# Patient Record
Sex: Male | Born: 1995 | Race: White | Hispanic: No | Marital: Single | Smoking: Never smoker
Health system: Southern US, Community
[De-identification: ages and names within clinical notes are randomized; demographics above are authoritative.]

## PROBLEM LIST (undated history)

## (undated) DIAGNOSIS — Z789 Other specified health status: Secondary | ICD-10-CM

## (undated) HISTORY — PX: TOE SURGERY: SHX1073

---
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
## 2017-09-20 DIAGNOSIS — J020 Streptococcal pharyngitis: Secondary | ICD-10-CM | POA: Diagnosis not present

## 2017-09-20 DIAGNOSIS — R509 Fever, unspecified: Secondary | ICD-10-CM | POA: Diagnosis not present

## 2018-08-09 DIAGNOSIS — L723 Sebaceous cyst: Secondary | ICD-10-CM | POA: Diagnosis not present

## 2018-12-26 DIAGNOSIS — S63502A Unspecified sprain of left wrist, initial encounter: Secondary | ICD-10-CM | POA: Diagnosis not present

## 2019-01-06 DIAGNOSIS — S63502D Unspecified sprain of left wrist, subsequent encounter: Secondary | ICD-10-CM | POA: Diagnosis not present

## 2019-01-13 DIAGNOSIS — R52 Pain, unspecified: Secondary | ICD-10-CM | POA: Diagnosis not present

## 2019-01-13 DIAGNOSIS — M25532 Pain in left wrist: Secondary | ICD-10-CM | POA: Diagnosis not present

## 2019-01-31 DIAGNOSIS — L723 Sebaceous cyst: Secondary | ICD-10-CM | POA: Diagnosis not present

## 2019-03-06 ENCOUNTER — Other Ambulatory Visit: Payer: Self-pay | Admitting: Family Medicine

## 2019-03-06 DIAGNOSIS — M7989 Other specified soft tissue disorders: Secondary | ICD-10-CM

## 2019-03-07 DIAGNOSIS — M7989 Other specified soft tissue disorders: Secondary | ICD-10-CM | POA: Diagnosis not present

## 2019-03-09 ENCOUNTER — Other Ambulatory Visit: Payer: 59

## 2019-04-04 ENCOUNTER — Other Ambulatory Visit: Payer: Self-pay | Admitting: Urology

## 2019-04-24 ENCOUNTER — Encounter (HOSPITAL_BASED_OUTPATIENT_CLINIC_OR_DEPARTMENT_OTHER): Payer: Self-pay | Admitting: *Deleted

## 2019-04-24 ENCOUNTER — Other Ambulatory Visit: Payer: Self-pay

## 2019-04-24 NOTE — Progress Notes (Signed)
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

## 2019-04-26 ENCOUNTER — Other Ambulatory Visit (HOSPITAL_COMMUNITY)
Admission: RE | Admit: 2019-04-26 | Discharge: 2019-04-26 | Disposition: A | Discharge status: Home / Self Care | Payer: 59 | Source: Ambulatory Visit | Attending: Urology | Admitting: Urology

## 2019-04-26 DIAGNOSIS — Z1159 Encounter for screening for other viral diseases: Secondary | ICD-10-CM | POA: Diagnosis not present

## 2019-04-26 DIAGNOSIS — Z01812 Encounter for preprocedural laboratory examination: Secondary | ICD-10-CM | POA: Insufficient documentation

## 2019-04-27 LAB — NOVEL CORONAVIRUS, NAA (HOSP ORDER, SEND-OUT TO REF LAB; TAT 18-24 HRS): SARS-CoV-2, NAA: NOT DETECTED

## 2019-04-27 NOTE — Progress Notes (Signed)
SPOKE W/  _      SCREENING SYMPTOMS OF COVID 19:   COUGH--  RUNNY NOSE---   SORE THROAT---  NASAL CONGESTION----  SNEEZING----  SHORTNESS OF BREATH---  DIFFICULTY BREATHING---  TEMP >100.0 -----  UNEXPLAINED BODY ACHES------  CHILLS --------   HEADACHES ---------  LOSS OF SMELL/ TASTE --------    HAVE YOU OR ANY FAMILY MEMBER TRAVELLED PAST 14 DAYS OUT OF THE   COUNTY--- STATE---- COUNTRY----  HAVE YOU OR ANY FAMILY MEMBER BEEN EXPOSED TO ANYONE WITH COVID 19?    Left message on voice mail at [phone]  and [phone] to return call for screening.

## 2019-04-27 NOTE — Progress Notes (Signed)
SPOKE W/ Algis     SCREENING SYMPTOMS OF COVID 19:   COUGH--  no  RUNNY NOSE--- no  SORE THROAT--- no  NASAL CONGESTION---- no  SNEEZING---- no  SHORTNESS OF BREATH--- no   DIFFICULTY BREATHING--- no  TEMP >100.0 ----- no  UNEXPLAINED BODY ACHES------ no  CHILLS --------  No   HEADACHES --------- no  LOSS OF SMELL/ TASTE -------- no    HAVE YOU OR ANY FAMILY MEMBER TRAVELLED PAST 14 DAYS OUT OF THE   COUNTY--- no STATE---- no COUNTRY---- no   HAVE YOU OR ANY FAMILY MEMBER BEEN EXPOSED TO ANYONE WITH COVID 19?  no

## 2019-04-28 ENCOUNTER — Ambulatory Visit (HOSPITAL_BASED_OUTPATIENT_CLINIC_OR_DEPARTMENT_OTHER)
Admission: RE | Admit: 2019-04-28 | Discharge: 2019-04-28 | Disposition: A | Discharge status: Home / Self Care | Payer: 59 | Attending: Urology | Admitting: Urology

## 2019-04-28 ENCOUNTER — Encounter (HOSPITAL_BASED_OUTPATIENT_CLINIC_OR_DEPARTMENT_OTHER)
Admission: RE | Disposition: A | Discharge status: Home / Self Care | Payer: Self-pay | Source: Home / Self Care | Attending: Urology

## 2019-04-28 ENCOUNTER — Ambulatory Visit (HOSPITAL_BASED_OUTPATIENT_CLINIC_OR_DEPARTMENT_OTHER): Payer: 59 | Admitting: Anesthesiology

## 2019-04-28 ENCOUNTER — Encounter (HOSPITAL_BASED_OUTPATIENT_CLINIC_OR_DEPARTMENT_OTHER): Payer: Self-pay

## 2019-04-28 DIAGNOSIS — L723 Sebaceous cyst: Secondary | ICD-10-CM | POA: Diagnosis not present

## 2019-04-28 HISTORY — DX: Other specified health status: Z78.9

## 2019-04-28 HISTORY — PX: CYST EXCISION: SHX5701

## 2019-04-28 SURGERY — CYST REMOVAL
Anesthesia: General | Laterality: Bilateral

## 2019-04-28 MED ORDER — TRAMADOL HCL 50 MG PO TABS: 50.0000 mg | ORAL_TABLET | Freq: Four times a day (QID) | ORAL | 0 refills | Status: AC | PRN

## 2019-04-28 MED ORDER — EPHEDRINE SULFATE-NACL 50-0.9 MG/10ML-% IV SOSY
PREFILLED_SYRINGE | INTRAVENOUS | Status: DC | PRN
  Administered 2019-04-28: 5 mg via INTRAVENOUS
  Administered 2019-04-28: 10 mg via INTRAVENOUS

## 2019-04-28 MED ORDER — BUPIVACAINE HCL (PF) 0.25 % IJ SOLN
INTRAMUSCULAR | Status: DC | PRN
  Administered 2019-04-28: 10 mL

## 2019-04-28 MED ORDER — FENTANYL CITRATE (PF) 100 MCG/2ML IJ SOLN
INTRAMUSCULAR | Status: DC | PRN
  Administered 2019-04-28 (×2): 50 ug via INTRAVENOUS

## 2019-04-28 MED ORDER — DEXAMETHASONE SODIUM PHOSPHATE 10 MG/ML IJ SOLN
INTRAMUSCULAR | Status: AC
  Filled 2019-04-28: qty 1

## 2019-04-28 MED ORDER — CLINDAMYCIN PHOSPHATE 900 MG/50ML IV SOLN
INTRAVENOUS | Status: AC
  Filled 2019-04-28: qty 50

## 2019-04-28 MED ORDER — LIDOCAINE 2% (20 MG/ML) 5 ML SYRINGE
INTRAMUSCULAR | Status: AC
  Filled 2019-04-28: qty 5

## 2019-04-28 MED ORDER — MEPERIDINE HCL 25 MG/ML IJ SOLN
6.2500 mg | INTRAMUSCULAR | Status: DC | PRN
  Filled 2019-04-28: qty 1

## 2019-04-28 MED ORDER — LIDOCAINE 2% (20 MG/ML) 5 ML SYRINGE
INTRAMUSCULAR | Status: DC | PRN
  Administered 2019-04-28: 100 mg via INTRAVENOUS

## 2019-04-28 MED ORDER — CLINDAMYCIN PHOSPHATE 900 MG/50ML IV SOLN
900.0000 mg | INTRAVENOUS | Status: AC
  Administered 2019-04-28: 900 mg via INTRAVENOUS
  Filled 2019-04-28: qty 50

## 2019-04-28 MED ORDER — OXYCODONE HCL 5 MG PO TABS
5.0000 mg | ORAL_TABLET | Freq: Once | ORAL | Status: DC | PRN
  Filled 2019-04-28: qty 1

## 2019-04-28 MED ORDER — OXYCODONE HCL 5 MG/5ML PO SOLN
5.0000 mg | Freq: Once | ORAL | Status: DC | PRN
  Filled 2019-04-28: qty 5

## 2019-04-28 MED ORDER — PROMETHAZINE HCL 25 MG/ML IJ SOLN
6.2500 mg | INTRAMUSCULAR | Status: DC | PRN
  Filled 2019-04-28: qty 1

## 2019-04-28 MED ORDER — EPHEDRINE 5 MG/ML INJ
INTRAVENOUS | Status: AC
  Filled 2019-04-28: qty 10

## 2019-04-28 MED ORDER — ONDANSETRON HCL 4 MG/2ML IJ SOLN
INTRAMUSCULAR | Status: DC | PRN
  Administered 2019-04-28: 4 mg via INTRAVENOUS

## 2019-04-28 MED ORDER — GLYCOPYRROLATE PF 0.2 MG/ML IJ SOSY
PREFILLED_SYRINGE | INTRAMUSCULAR | Status: DC | PRN
  Administered 2019-04-28: .2 mg via INTRAVENOUS

## 2019-04-28 MED ORDER — HYDROMORPHONE HCL 1 MG/ML IJ SOLN
0.2500 mg | INTRAMUSCULAR | Status: DC | PRN
  Filled 2019-04-28: qty 0.5

## 2019-04-28 MED ORDER — DEXAMETHASONE SODIUM PHOSPHATE 4 MG/ML IJ SOLN
INTRAMUSCULAR | Status: DC | PRN
  Administered 2019-04-28: 10 mg via INTRAVENOUS

## 2019-04-28 MED ORDER — MIDAZOLAM HCL 2 MG/2ML IJ SOLN
INTRAMUSCULAR | Status: AC
  Filled 2019-04-28: qty 2

## 2019-04-28 MED ORDER — GLYCOPYRROLATE PF 0.2 MG/ML IJ SOSY
PREFILLED_SYRINGE | INTRAMUSCULAR | Status: AC
  Filled 2019-04-28: qty 1

## 2019-04-28 MED ORDER — PROPOFOL 10 MG/ML IV BOLUS
INTRAVENOUS | Status: AC
  Filled 2019-04-28: qty 20

## 2019-04-28 MED ORDER — LACTATED RINGERS IV SOLN
INTRAVENOUS | Status: DC
  Administered 2019-04-28 (×2): via INTRAVENOUS
  Filled 2019-04-28: qty 1000

## 2019-04-28 MED ORDER — MIDAZOLAM HCL 5 MG/5ML IJ SOLN
INTRAMUSCULAR | Status: DC | PRN
  Administered 2019-04-28: 2 mg via INTRAVENOUS

## 2019-04-28 MED ORDER — PROPOFOL 10 MG/ML IV BOLUS
INTRAVENOUS | Status: DC | PRN
  Administered 2019-04-28: 200 mg via INTRAVENOUS

## 2019-04-28 MED ORDER — FENTANYL CITRATE (PF) 100 MCG/2ML IJ SOLN
INTRAMUSCULAR | Status: AC
  Filled 2019-04-28: qty 2

## 2019-04-28 MED ORDER — ONDANSETRON HCL 4 MG/2ML IJ SOLN
INTRAMUSCULAR | Status: AC
  Filled 2019-04-28: qty 2

## 2019-04-28 SURGICAL SUPPLY — 40 items
BLADE CLIPPER SENSICLIP SURGIC (BLADE) ×3 IMPLANT
BLADE HEX COATED 2.75 (ELECTRODE) ×3 IMPLANT
BLADE SURG 15 STRL LF DISP TIS (BLADE) ×1 IMPLANT
BLADE SURG 15 STRL SS (BLADE) ×2
BNDG GAUZE ELAST 4 BULKY (GAUZE/BANDAGES/DRESSINGS) ×3 IMPLANT
BRIEF STRETCH FOR OB PAD LRG (UNDERPADS AND DIAPERS) ×3 IMPLANT
COVER BACK TABLE 60X90IN (DRAPES) ×3 IMPLANT
COVER MAYO STAND STRL (DRAPES) ×3 IMPLANT
COVER WAND RF STERILE (DRAPES) ×6 IMPLANT
DERMABOND ADVANCED (GAUZE/BANDAGES/DRESSINGS) ×2
DERMABOND ADVANCED .7 DNX12 (GAUZE/BANDAGES/DRESSINGS) ×1 IMPLANT
DISSECTOR ROUND CHERRY 3/8 STR (MISCELLANEOUS) IMPLANT
DRAIN PENROSE 18X1/4 LTX STRL (WOUND CARE) ×3 IMPLANT
DRAPE LAPAROTOMY 100X72 PEDS (DRAPES) ×3 IMPLANT
DRSG TEGADERM 4X4.75 (GAUZE/BANDAGES/DRESSINGS) IMPLANT
ELECT REM PT RETURN 9FT ADLT (ELECTROSURGICAL) ×3
ELECTRODE REM PT RTRN 9FT ADLT (ELECTROSURGICAL) ×1 IMPLANT
GLOVE BIO SURGEON STRL SZ7.5 (GLOVE) ×3 IMPLANT
GOWN STRL REUS W/TWL LRG LVL3 (GOWN DISPOSABLE) ×3 IMPLANT
KIT TURNOVER CYSTO (KITS) ×3 IMPLANT
NEEDLE HYPO 25X1 1.5 SAFETY (NEEDLE) ×3 IMPLANT
NS IRRIG 500ML POUR BTL (IV SOLUTION) IMPLANT
PACK BASIN DAY SURGERY FS (CUSTOM PROCEDURE TRAY) ×3 IMPLANT
PENCIL BUTTON HOLSTER BLD 10FT (ELECTRODE) ×3 IMPLANT
SUT ETHILON 4 0 PS 2 18 (SUTURE) ×3 IMPLANT
SUT MNCRL AB 4-0 PS2 18 (SUTURE) ×3 IMPLANT
SUT VIC AB 2-0 SH 27 (SUTURE)
SUT VIC AB 2-0 SH 27XBRD (SUTURE) IMPLANT
SUT VIC AB 3-0 SH 27 (SUTURE) ×2
SUT VIC AB 3-0 SH 27X BRD (SUTURE) IMPLANT
SUT VIC AB 3-0 SH 27XBRD (SUTURE) ×1 IMPLANT
SUT VIC AB 4-0 SH 27 (SUTURE) ×2
SUT VIC AB 4-0 SH 27XANBCTRL (SUTURE) ×1 IMPLANT
SYR CONTROL 10ML LL (SYRINGE) ×3 IMPLANT
TOWEL OR 17X26 10 PK STRL BLUE (TOWEL DISPOSABLE) ×6 IMPLANT
TRAY DSU PREP LF (CUSTOM PROCEDURE TRAY) ×3 IMPLANT
TUBE CONNECTING 12'X1/4 (SUCTIONS) ×1
TUBE CONNECTING 12X1/4 (SUCTIONS) ×2 IMPLANT
WATER STERILE IRR 500ML POUR (IV SOLUTION) IMPLANT
YANKAUER SUCT BULB TIP NO VENT (SUCTIONS) ×3 IMPLANT

## 2019-04-28 NOTE — Brief Op Note (Signed)
04/28/2019  10:01 AM  PATIENT:  Walter Reyes  23 y.o. male  PRE-OPERATIVE DIAGNOSIS:  bilateral sebaceous scrotal cysts  POST-OPERATIVE DIAGNOSIS:  bilateral sebaceous scrotal cysts  PROCEDURE:  Procedure(s): CYST REMOVAL (Bilateral)  SURGEON:  Surgeon(s) and Role:    Alexis Frock, MD - Primary  PHYSICIAN ASSISTANT:   ASSISTANTS: none   ANESTHESIA:   local and general  EBL:  minimal   BLOOD ADMINISTERED:none  DRAINS: none   LOCAL MEDICATIONS USED:  MARCAINE     SPECIMEN:  Source of Specimen:  penoscrotal sebacous cysts  DISPOSITION OF SPECIMEN:  discard  COUNTS:  YES  TOURNIQUET:  * No tourniquets in log *  DICTATION: .Other Dictation: Dictation Number [id number]  PLAN OF CARE: Discharge to home after PACU  PATIENT DISPOSITION:  PACU - hemodynamically stable.   Delay start of Pharmacological VTE agent (>24hrs) due to surgical blood loss or risk of bleeding: yes

## 2019-04-28 NOTE — Transfer of Care (Addendum)
Last Vitals:  Vitals Value Taken Time  BP 120/52 04/28/19 1017  Temp    Pulse 70 04/28/19 1017  Resp 13 04/28/19 1017  SpO2 100 % 04/28/19 1017    Last Pain:  Vitals:   04/28/19 0742  TempSrc: Oral  PainSc: 0-No pain      Patients Stated Pain Goal: 6 (04/28/19 7253)  Immediate Anesthesia Transfer of Care Note  Patient: Walter Reyes  Procedure(s) Performed: Procedure(s) (LRB): CYST REMOVAL (Bilateral)  Patient Location: PACU  Anesthesia Type: General  Level of Consciousness: sleepy  Airway & Oxygen Therapy: Patient Spontanous Breathing and Patient connected to nasal cannula oxygen, oral airway in place.  Post-op Assessment: Report given to PACU RN and Post -op Vital signs reviewed and stable  Post vital signs: Reviewed and stable  Complications: No apparent anesthesia complications

## 2019-04-28 NOTE — H&P (Signed)
Walter Reyes is an 23 y.o. male.    Chief Complaint: Pre-OP Penoscrotal cyst removal  HPI:   1 - Scrotal Sebaceous Cysts - several bilatearl cysts stable since mid teens. Left penoscrotal 1cm and crop of smaller ones right penoscrotal. No h/o superinfection. Non-diabetic. He is increasingly bothored by appearance and that they will catch on things.   PMH UNremarkable. NO CV disease / blood thinners. He works in [employer]. His PCP is Dibas Koirala MD.   Today "Walter Reyes" is seen to proceed with excision of penoscrotal cysts. No interval fevers.    Past Medical History:  Diagnosis Date  . Medical history non-contributory     Past Surgical History:  Procedure Laterality Date  . TOE SURGERY      History reviewed. No pertinent family history. Social History:  reports that he has never smoked. He has never used smokeless tobacco. He reports that he does not drink alcohol or use drugs.  Allergies: No Known Allergies  No medications prior to admission.    Results for orders placed or performed during the hospital encounter of 04/26/19 (from the past 48 hour(s))  Novel Coronavirus, NAA (hospital order; send-out to ref lab)     Status: None   Collection Time: 04/26/19  9:31 AM   Specimen: Nasopharyngeal Swab; Respiratory  Result Value Ref Range   SARS-CoV-2, NAA NOT DETECTED NOT DETECTED    Comment: (NOTE) Testing was performed using the cobas(R) SARS-CoV-2 test. This test was developed and its performance characteristics determined by [employer]. This test has not been FDA cleared or approved. This test has been authorized by FDA under an Emergency Use Authorization (EUA). This test is only authorized for the duration of time the declaration that circumstances exist justifying the authorization of the emergency use of in vitro diagnostic tests for detection of SARS-CoV-2 virus and/or diagnosis of COVID-19 infection under section 564(b)(1) of the Act, 21  U.S.C. 034VQQ-5(Z)(5), unless the authorization is terminated or revoked sooner. When diagnostic testing is negative, the possibility of a false negative result should be considered in the context of a patient's recent exposures and the presence of clinical signs and symptoms consistent with COVID-19. An individual without symptoms of COVID-19 and who is not shedding SARS-CoV-2 virus would expect to have  a negative (not detected) result in this assay. Performed At: [facility] [place], [place] [id number] Rush Farmer MD IR:[id number]    Coronavirus Source NASOPHARYNGEAL     Comment: Performed at [facility], [place] [address] [place], [place] [address]   No results found.  Review of Systems  Constitutional: Negative for chills and fever.  All other systems reviewed and are negative.   Height 6' (1.829 m), weight 77.1 kg. Physical Exam  Constitutional: He appears well-developed.  HENT:  Head: Normocephalic.  Eyes: Pupils are equal, round, and reactive to light.  Neck: Normal range of motion.  Cardiovascular: Normal rate.  Respiratory: Effort normal.  GI: Soft.  Genitourinary:    Genitourinary Comments: Stable penoscrotal cysts w/o superinfection   Musculoskeletal: Normal range of motion.  Neurological: He is alert.  Skin: Skin is warm.  Psychiatric: He has a normal mood and affect.     Assessment/Plan  Proceed as planned with excision of penoscrotal cysts. Risks, benefits, alternatives, expected peri-op course discussed previously and reiterated today.   Alexis Frock, MD 04/28/2019, 5:29 AM

## 2019-04-28 NOTE — Anesthesia Preprocedure Evaluation (Signed)
Anesthesia Evaluation  Patient identified by MRN, date of birth, ID band Patient awake    Reviewed: Allergy & Precautions, NPO status , Patient's Chart, lab work & pertinent test results  Airway Mallampati: II  TM Distance: >3 FB Neck ROM: Full    Dental no notable dental hx.    Pulmonary neg pulmonary ROS,    Pulmonary exam normal breath sounds clear to auscultation       Cardiovascular negative cardio ROS Normal cardiovascular exam Rhythm:Regular Rate:Normal     Neuro/Psych negative neurological ROS  negative psych ROS   GI/Hepatic negative GI ROS, Neg liver ROS,   Endo/Other  negative endocrine ROS  Renal/GU negative Renal ROS  negative genitourinary   Musculoskeletal negative musculoskeletal ROS (+)   Abdominal   Peds negative pediatric ROS (+)  Hematology negative hematology ROS (+)   Anesthesia Other Findings   Reproductive/Obstetrics negative OB ROS                             Anesthesia Physical Anesthesia Plan  ASA: I  Anesthesia Plan: General   Post-op Pain Management:    Induction: Intravenous  PONV Risk Score and Plan: 2 and Ondansetron, Midazolam and Treatment may vary due to age or medical condition  Airway Management Planned: LMA  Additional Equipment:   Intra-op Plan:   Post-operative Plan: Extubation in OR  Informed Consent: I have reviewed the patients History and Physical, chart, labs and discussed the procedure including the risks, benefits and alternatives for the proposed anesthesia with the patient or authorized representative who has indicated his/her understanding and acceptance.     Dental advisory given  Plan Discussed with: CRNA  Anesthesia Plan Comments:         Anesthesia Quick Evaluation  

## 2019-04-28 NOTE — Anesthesia Procedure Notes (Signed)
Procedure Name: LMA Insertion Date/Time: 04/28/2019 9:35 AM Performed by: Lynda Rainwater, MD Pre-anesthesia Checklist: Patient identified, Emergency Drugs available, Suction available and Patient being monitored Patient Re-evaluated:Patient Re-evaluated prior to induction Oxygen Delivery Method: Circle system utilized Preoxygenation: Pre-oxygenation with 100% oxygen Induction Type: IV induction Ventilation: Mask ventilation without difficulty LMA: LMA inserted LMA Size: 5.0 Number of attempts: 1 Airway Equipment and Method: Bite block Placement Confirmation: positive ETCO2 Tube secured with: Tape Dental Injury: Teeth and Oropharynx as per pre-operative assessment

## 2019-04-28 NOTE — Progress Notes (Signed)
SPOKE W/  _Stuart     SCREENING SYMPTOMS OF COVID 19:   COUGH--no  RUNNY NOSE--- no  SORE THROAT---no  NASAL CONGESTION----no  SNEEZING----no  SHORTNESS OF BREATH---no  DIFFICULTY BREATHING---no  TEMP >100.0 -----no  UNEXPLAINED BODY ACHES------no  CHILLS -------- no  HEADACHES ---------no  LOSS OF SMELL/ TASTE --------no    HAVE YOU OR ANY FAMILY MEMBER TRAVELLED PAST 14 DAYS OUT OF THE   COUNTY---no STATE----no COUNTRY----no  HAVE YOU OR ANY FAMILY MEMBER BEEN EXPOSED TO ANYONE WITH COVID 19? no

## 2019-04-28 NOTE — Op Note (Signed)
NAME: Walter Reyes, Walter Reyes MEDICAL RECORD [id number] ACCOUNT [id number] DATE OF BIRTH:08-May-1996 FACILITY: [facility] LOCATION: [facility] PHYSICIAN:Scotlynn Noyes, MD  OPERATIVE REPORT  DATE OF PROCEDURE:  04/28/2019  PREOPERATIVE DIAGNOSIS:  Penile scrotal sebaceous cyst.  PROCEDURE:  Excision of the penoscrotal sebaceous cyst approximately 2 sq cm.  ESTIMATED BLOOD LOSS:  Nil.  COMPLICATIONS:  None.  SPECIMEN:  Scrotal cyst for discard.  FINDINGS: 1.  Approximately 1 cm area of the left penoscrotal sebaceous cyst was excised in elliptical fashion. 2.  Approximately 1 cm conglomerate of scrotal sebaceous cyst, right at midline superior completely excised.  INDICATIONS:  The patient is a pleasant 23 year old young man with significant bother from sebaceous penoscrotal cysts that have been present for some time.  They failed to resolve with conservative measures.  Options were discussed including observation  versus excision, and he adamantly prefers the latter.  Informed consent was obtained and placed in the medical record.  PROCEDURE IN DETAIL:  The patient being identified, the procedure being penoscrotal cyst excision was confirmed.  Procedure timeout was performed.  IV antibiotics were administered.    General LMA anesthesia was induced.  The patient was placed into a  supine position.  A sterile field was created by clipper shaving and then prepping and draping the patient's penoscrotal junction on the inferior aspect and prepping with chlorhexidine gluconate.  Very careful inspection under anesthesia.  This revealed  the aforementioned dominant areas of cysts.  These were persistent from his preoperative evaluation.  Multiple incisions were made encompassing the penoscrotal cyst area, a separate elliptical incision encompassing the scrotal area.  These were released  using point cautery dissection.  Scarpa's was reapproximated with running 3-0 Vicryl and the skin using interrupted  subcuticular Monocryl followed by point Dermabond.  Hemostasis was excellent.  Sponge and needle counts were correct, and the procedure  was terminated.  The patient tolerated the procedure well.  No immediate perioperative complications.  The patient was taken to postanesthesia care unit in stable condition.  LN/NUANCE  D:04/28/2019 T:04/28/2019 JOB:[id number]

## 2019-04-28 NOTE — Discharge Instructions (Signed)
°  Post Anesthesia Home Care Instructions  Activity: Get plenty of rest for the remainder of the day. A responsible individual must stay with you for 24 hours following the procedure.  For the next 24 hours, DO NOT: -Drive a car -Paediatric nurse -Drink alcoholic beverages -Take any medication unless instructed by your physician -Make any legal decisions or sign important papers.  Meals: Start with liquid foods such as gelatin or soup. Progress to regular foods as tolerated. Avoid greasy, spicy, heavy foods. If nausea and/or vomiting occur, drink only clear liquids until the nausea and/or vomiting subsides. Call your physician if vomiting continues.  Special Instructions/Symptoms: Your throat may feel dry or sore from the anesthesia or the breathing tube placed in your throat during surgery. If this causes discomfort, gargle with warm salt water. The discomfort should disappear within 24 hours.  If you had a scopolamine patch placed behind your ear for the management of post- operative nausea and/or vomiting:  1. The medication in the patch is effective for 72 hours, after which it should be removed.  Wrap patch in a tissue and discard in the trash. Wash hands thoroughly with soap and water. 2. You may remove the patch earlier than 72 hours if you experience unpleasant side effects which may include dry mouth, dizziness or visual disturbances. 3. Avoid touching the patch. Wash your hands with soap and water after contact with the patch.    1 - Stitches are dissolvable and will dissapear over next 2 weeks. No sexual stimulation x 2 weeks, otherwise no restrictions. You can shower immediately.   2 - Call MD or go to ER for fever >102, severe pain / nausea / vomiting not relieved by medications, or acute change in medical status

## 2019-04-28 NOTE — Anesthesia Postprocedure Evaluation (Signed)
Anesthesia Post Note  Patient: Walter Reyes  Procedure(s) Performed: CYST REMOVAL (Bilateral )     Patient location during evaluation: PACU Anesthesia Type: General Level of consciousness: awake and alert Pain management: pain level controlled Vital Signs Assessment: post-procedure vital signs reviewed and stable Respiratory status: spontaneous breathing, nonlabored ventilation and respiratory function stable Cardiovascular status: blood pressure returned to baseline and stable Postop Assessment: no apparent nausea or vomiting Anesthetic complications: no    Last Vitals:  Vitals:   04/28/19 1045 04/28/19 1100  BP: 119/63 (!) 116/59  Pulse: 85 70  Resp: 12 12  Temp: (!) 36.4 C   SpO2: 100% 100%    Last Pain:  Vitals:   04/28/19 1045  TempSrc:   PainSc: 0-No pain                 Lynda Rainwater

## 2019-05-01 ENCOUNTER — Encounter (HOSPITAL_BASED_OUTPATIENT_CLINIC_OR_DEPARTMENT_OTHER): Payer: Self-pay | Admitting: Urology
